# Patient Record
Sex: Female | Born: 1980 | Race: Black or African American | Hispanic: No | Marital: Single | Smoking: Never smoker
Health system: Southern US, Community
[De-identification: ages and names within clinical notes are randomized; demographics above are authoritative.]

---

## 2013-12-03 ENCOUNTER — Emergency Department: Payer: Self-pay | Admitting: Emergency Medicine

## 2014-06-01 ENCOUNTER — Emergency Department: Payer: Self-pay | Admitting: Emergency Medicine

## 2014-06-01 LAB — URINALYSIS, COMPLETE
BLOOD: NEGATIVE
Bilirubin,UR: NEGATIVE
Glucose,UR: NEGATIVE mg/dL (ref 0–75)
Ketone: NEGATIVE
NITRITE: NEGATIVE
Ph: 5 (ref 4.5–8.0)
RBC,UR: 8 /HPF (ref 0–5)
SPECIFIC GRAVITY: 1.03 (ref 1.003–1.030)
Squamous Epithelial: 10

## 2014-06-01 LAB — GC/CHLAMYDIA PROBE AMP

## 2014-06-01 LAB — WET PREP, GENITAL

## 2014-06-02 LAB — URINE CULTURE

## 2014-06-19 ENCOUNTER — Emergency Department: Payer: Self-pay | Admitting: Emergency Medicine

## 2014-08-13 ENCOUNTER — Emergency Department
Admit: 2014-08-13 | Disposition: A | Discharge status: Home / Self Care | Payer: Self-pay | Admitting: Emergency Medicine

## 2014-08-15 LAB — BETA STREP CULTURE(ARMC)

## 2014-10-25 ENCOUNTER — Emergency Department
Admission: EM | Admit: 2014-10-25 | Discharge: 2014-10-25 | Disposition: A | Discharge status: Home / Self Care | Payer: Medicaid Other | Attending: Emergency Medicine | Admitting: Emergency Medicine

## 2014-10-25 ENCOUNTER — Encounter: Payer: Self-pay | Admitting: Emergency Medicine

## 2014-10-25 DIAGNOSIS — R21 Rash and other nonspecific skin eruption: Secondary | ICD-10-CM | POA: Diagnosis present

## 2014-10-25 MED ORDER — HYDROXYZINE PAMOATE 25 MG PO CAPS: 25.0000 mg | ORAL_CAPSULE | Freq: Three times a day (TID) | ORAL | Status: AC | PRN

## 2014-10-25 MED ORDER — PREDNISONE 10 MG (21) PO TBPK: ORAL_TABLET | ORAL | Status: AC

## 2014-10-25 MED ORDER — LORATADINE 10 MG PO TABS: 10.0000 mg | ORAL_TABLET | Freq: Every day | ORAL | Status: AC | PRN

## 2014-10-25 MED ORDER — DOXYCYCLINE HYCLATE 100 MG PO CAPS: 100.0000 mg | ORAL_CAPSULE | Freq: Two times a day (BID) | ORAL | Status: AC

## 2014-10-25 MED ORDER — FLUCONAZOLE 100 MG PO TABS: 100.0000 mg | ORAL_TABLET | Freq: Every day | ORAL | Status: AC

## 2014-10-25 NOTE — ED Notes (Signed)
Pt reports started with a rash on Sunday night. States she felt a sting and now has a rash on her neck.

## 2014-10-25 NOTE — ED Provider Notes (Signed)
[facility] Emergency Department Provider Note  ____________________________________________  Time seen: 61  I have reviewed the triage vital signs and the nursing notes.   HISTORY  Chief Complaint Rash    HPI Elizabeth Rocha is a 34 y.o. female patient arrives today with what appear to be bites to her neck and back and shoulder patient stated a motel over the weekend concerned about possible bedbug sits on the areas become red firm says they itch more than they hurt overall would say her discomfort as about an 8 out of 10 nothing making it particularly better or worse no other symptoms at this time is here today for further evaluation and treatment   No past medical history on file.  There are no active problems to display for this patient.   No past surgical history on file.  Current Outpatient Rx  Name  Route  Sig  Dispense  Refill  . doxycycline (VIBRAMYCIN) 100 MG capsule   Oral   Take 1 capsule (100 mg total) by mouth 2 (two) times daily.   14 capsule   0   . fluconazole (DIFLUCAN) 100 MG tablet   Oral   Take 1 tablet (100 mg total) by mouth daily.   14 tablet   0   . hydrOXYzine (VISTARIL) 25 MG capsule   Oral   Take 1 capsule (25 mg total) by mouth 3 (three) times daily as needed for itching.   15 capsule   0   . loratadine (CLARITIN) 10 MG tablet   Oral   Take 1 tablet (10 mg total) by mouth daily as needed for allergies.   30 tablet   2   . predniSONE (STERAPRED UNI-PAK 21 TAB) 10 MG (21) TBPK tablet      Take 6 tablets on day 1 Take 5 tablets on day 2 Take 4 tablets on day 3 Take 3 tablets on day 4 Take 2 tablets on day 5 Take 1 tablet on day 6   21 tablet   0     Allergies Lorcet and Percocet  No family history on file.  Social History History  Substance Use Topics  . Smoking status: Never Smoker   . Smokeless tobacco: Not on file  . Alcohol Use: No    Review of Systems Constitutional: No  fever/chills Eyes: No visual changes. ENT: No sore throat. Cardiovascular: Denies chest pain. Respiratory: Denies shortness of breath. Gastrointestinal: No abdominal pain.  No nausea, no vomiting.  No diarrhea.  No constipation. Genitourinary: Negative for dysuria. Musculoskeletal: Negative for back pain. Skin: Negative for rash. Neurological: Negative for headaches, focal weakness or numbness.  10-point ROS otherwise negative.  ____________________________________________   PHYSICAL EXAM:  VITAL SIGNS: ED Triage Vitals  Enc Vitals Group     BP 10/25/14 0748 131/67 mmHg     Pulse Rate 10/25/14 0748 78     Resp 10/25/14 0748 20     Temp 10/25/14 0748 98.1 F (36.7 C)     Temp Source 10/25/14 0748 Oral     SpO2 10/25/14 0748 100 %     Weight 10/25/14 0748 165 lb (74.844 kg)     Height 10/25/14 0748 5' (1.524 m)     Head Cir --      Peak Flow --      Pain Score 10/25/14 0749 8     Pain Loc --      Pain Edu? --      Excl. in GC? --  Constitutional: Alert and oriented. Well appearing and in no acute distress. Eyes: Conjunctivae are normal. PERRL. EOMI. Head: Atraumatic. Nose: No congestion/rhinnorhea. Mouth/Throat: Mucous membranes are moist.  Oropharynx non-erythematous. Neck: No stridor.   Cardiovascular: Normal rate, regular rhythm. Grossly normal heart sounds.  Good peripheral circulation. Respiratory: Normal respiratory effort.  No retractions. Lungs CTAB. Musculoskeletal: No lower extremity tenderness nor edema.  No joint effusions. Neurologic:  Normal speech and language. No gross focal neurologic deficits are appreciated. Speech is normal. No gait instability. Skin:  Skin is warm, dry and intact. Multiple bite like lesions to her neck and back some red firm and inflamed Psychiatric: Mood and affect are normal. Speech and behavior are normal.  ____________________________________________     PROCEDURES  Procedure(s) performed: None  Critical Care  performed: No  ____________________________________________   INITIAL IMPRESSION / ASSESSMENT AND PLAN / ED COURSE  Pertinent labs & imaging results that were available during my care of the patient were reviewed by me and considered in my medical decision making (see chart for details).  Initial impression skin rash probable bug bites patient stated no motor cells concerned about bed bugs given the distribution patient also concerned of infection will start her on anti-histamine steroid-induced antibiotics follow-up here as needed for any worsening rash return if any acute concerns or worsening symptoms ____________________________________________   FINAL CLINICAL IMPRESSION(S) / ED DIAGNOSES  Final diagnoses:  Rash and nonspecific skin eruption      Jeramy Dimmick Rosalyn GessWilliam C Tyliek Timberman, PA-C 10/25/14 [id number]  Sharman CheekPhillip Stafford, MD 10/25/14 [phone]

## 2015-01-10 ENCOUNTER — Ambulatory Visit
Admission: RE | Admit: 2015-01-10 | Discharge: 2015-01-10 | Disposition: A | Discharge status: Home / Self Care | Payer: Medicaid Other | Source: Ambulatory Visit | Attending: Family Medicine | Admitting: Family Medicine

## 2015-01-10 ENCOUNTER — Other Ambulatory Visit: Payer: Self-pay | Admitting: Family Medicine

## 2015-01-10 DIAGNOSIS — M25552 Pain in left hip: Secondary | ICD-10-CM | POA: Insufficient documentation

## 2015-02-16 ENCOUNTER — Other Ambulatory Visit: Payer: Self-pay | Admitting: Orthopedic Surgery

## 2015-02-16 DIAGNOSIS — M25551 Pain in right hip: Secondary | ICD-10-CM

## 2015-02-16 DIAGNOSIS — G8929 Other chronic pain: Secondary | ICD-10-CM

## 2015-02-16 DIAGNOSIS — M25552 Pain in left hip: Principal | ICD-10-CM

## 2015-02-16 DIAGNOSIS — M5441 Lumbago with sciatica, right side: Secondary | ICD-10-CM

## 2015-02-16 DIAGNOSIS — M5442 Lumbago with sciatica, left side: Secondary | ICD-10-CM

## 2015-02-28 ENCOUNTER — Ambulatory Visit
Admission: RE | Admit: 2015-02-28 | Discharge: 2015-02-28 | Disposition: A | Discharge status: Home / Self Care | Payer: Medicaid Other | Source: Ambulatory Visit | Attending: Orthopedic Surgery | Admitting: Orthopedic Surgery

## 2015-02-28 DIAGNOSIS — M1288 Other specific arthropathies, not elsewhere classified, other specified site: Secondary | ICD-10-CM | POA: Insufficient documentation

## 2015-02-28 DIAGNOSIS — M5442 Lumbago with sciatica, left side: Secondary | ICD-10-CM

## 2015-02-28 DIAGNOSIS — G8929 Other chronic pain: Secondary | ICD-10-CM | POA: Diagnosis not present

## 2015-02-28 DIAGNOSIS — M25551 Pain in right hip: Secondary | ICD-10-CM | POA: Insufficient documentation

## 2015-02-28 DIAGNOSIS — M25552 Pain in left hip: Secondary | ICD-10-CM | POA: Diagnosis present

## 2015-02-28 DIAGNOSIS — M545 Low back pain: Secondary | ICD-10-CM | POA: Diagnosis present

## 2015-02-28 DIAGNOSIS — M5441 Lumbago with sciatica, right side: Secondary | ICD-10-CM

## 2015-08-08 ENCOUNTER — Emergency Department
Admission: EM | Admit: 2015-08-08 | Discharge: 2015-08-08 | Disposition: A | Discharge status: Home / Self Care | Payer: Medicaid Other | Attending: Emergency Medicine | Admitting: Emergency Medicine

## 2015-08-08 ENCOUNTER — Encounter: Payer: Self-pay | Admitting: Emergency Medicine

## 2015-08-08 DIAGNOSIS — K625 Hemorrhage of anus and rectum: Secondary | ICD-10-CM | POA: Insufficient documentation

## 2015-08-08 DIAGNOSIS — Z79899 Other long term (current) drug therapy: Secondary | ICD-10-CM | POA: Diagnosis not present

## 2015-08-08 DIAGNOSIS — Z792 Long term (current) use of antibiotics: Secondary | ICD-10-CM | POA: Insufficient documentation

## 2015-08-08 LAB — CBC
HCT: 36 % (ref 35.0–47.0)
Hemoglobin: 12.1 g/dL (ref 12.0–16.0)
MCH: 28.3 pg (ref 26.0–34.0)
MCHC: 33.7 g/dL (ref 32.0–36.0)
MCV: 84 fL (ref 80.0–100.0)
PLATELETS: 351 10*3/uL (ref 150–440)
RBC: 4.29 MIL/uL (ref 3.80–5.20)
RDW: 13.5 % (ref 11.5–14.5)
WBC: 6.7 10*3/uL (ref 3.6–11.0)

## 2015-08-08 LAB — COMPREHENSIVE METABOLIC PANEL
ALK PHOS: 53 U/L (ref 38–126)
ALT: 10 U/L — AB (ref 14–54)
AST: 13 U/L — ABNORMAL LOW (ref 15–41)
Albumin: 4.3 g/dL (ref 3.5–5.0)
Anion gap: 5 (ref 5–15)
BILIRUBIN TOTAL: 0.9 mg/dL (ref 0.3–1.2)
BUN: 8 mg/dL (ref 6–20)
CALCIUM: 9.2 mg/dL (ref 8.9–10.3)
CO2: 26 mmol/L (ref 22–32)
CREATININE: 0.61 mg/dL (ref 0.44–1.00)
Chloride: 104 mmol/L (ref 101–111)
Glucose, Bld: 101 mg/dL — ABNORMAL HIGH (ref 65–99)
Potassium: 3.5 mmol/L (ref 3.5–5.1)
Sodium: 135 mmol/L (ref 135–145)
TOTAL PROTEIN: 7.8 g/dL (ref 6.5–8.1)

## 2015-08-08 MED ORDER — HYDROCORTISONE 2.5 % RE CREA: 1.0000 "application " | TOPICAL_CREAM | Freq: Two times a day (BID) | RECTAL | Status: AC

## 2015-08-08 NOTE — Discharge Instructions (Signed)
Gastrointestinal Bleeding °Gastrointestinal (GI) bleeding means there is bleeding somewhere along the digestive tract, between the mouth and anus. °CAUSES  °There are many different problems that can cause GI bleeding. Possible causes include: °· Esophagitis. This is inflammation, irritation, or swelling of the esophagus. °· Hemorrhoids. These are veins that are full of blood (engorged) in the rectum. They cause pain, inflammation, and may bleed. °· Anal fissures. These are areas of painful tearing which may bleed. They are often caused by passing hard stool. °· Diverticulosis. These are pouches that form on the colon over time, with age, and may bleed significantly. °· Diverticulitis. This is inflammation in areas with diverticulosis. It can cause pain, fever, and bloody stools, although bleeding is rare. °· Polyps and cancer. Colon cancer often starts out as precancerous polyps. °· Gastritis and ulcers. Bleeding from the upper gastrointestinal tract (near the stomach) may travel through the intestines and produce black, sometimes tarry, often bad smelling stools. In certain cases, if the bleeding is fast enough, the stools may not be black, but red. This condition may be life-threatening. °SYMPTOMS  °· Vomiting bright red blood or material that looks like coffee grounds. °· Bloody, black, or tarry stools. °DIAGNOSIS  °Your caregiver may diagnose your condition by taking your history and performing a physical exam. More tests may be needed, including: °· X-rays and other imaging tests. °· Esophagogastroduodenoscopy (EGD). This test uses a flexible, lighted tube to look at your esophagus, stomach, and small intestine. °· Colonoscopy. This test uses a flexible, lighted tube to look at your colon. °TREATMENT  °Treatment depends on the cause of your bleeding.  °· For bleeding from the esophagus, stomach, small intestine, or colon, the caregiver doing your EGD or colonoscopy may be able to stop the bleeding as part of  the procedure. °· Inflammation or infection of the colon can be treated with medicines. °· Many rectal problems can be treated with creams, suppositories, or warm baths. °· Surgery is sometimes needed. °· Blood transfusions are sometimes needed if you have lost a lot of blood. °If bleeding is slow, you may be allowed to go home. If there is a lot of bleeding, you will need to stay in the hospital for observation. °HOME CARE INSTRUCTIONS  °· Take any medicines exactly as prescribed. °· Keep your stools soft by eating foods that are high in fiber. These foods include whole grains, legumes, fruits, and vegetables. Prunes (1 to 3 a day) work well for many people. °· Drink enough fluids to keep your urine clear or pale yellow. °SEEK IMMEDIATE MEDICAL CARE IF:  °· Your bleeding increases. °· You feel lightheaded, weak, or you faint. °· You have severe cramps in your back or abdomen. °· You pass large blood clots in your stool. °· Your problems are getting worse. °MAKE SURE YOU:  °· Understand these instructions. °· Will watch your condition. °· Will get help right away if you are not doing well or get worse. °  °This information is not intended to replace advice given to you by your health care provider. Make sure you discuss any questions you have with your health care provider. °  °Document Released: 04/11/2000 Document Revised: 03/31/2012 Document Reviewed: 10/02/2014 °Elsevier Interactive Patient Education ©2016 [employer] ° °Please return immediately if condition worsens. Please contact her primary physician or the physician you were given for referral. If you have any specialist physicians involved in her treatment and plan please also contact them. Thank you for using [facility]. ° °

## 2015-08-08 NOTE — ED Provider Notes (Signed)
Time Seen: Approximately 1840  I have reviewed the triage notes  Chief Complaint: Rectal Bleeding   History of Present Illness: Elizabeth Rocha is a 35 y.o. female who presents with some small amount of rectal bleeding. She states the bleeding started out as small bright red spots on on the tissue and then she noticed that she had more significant bleeding described as more of a dark maroon color. She denies any abdominal pain. She states it was approximately half a cup of blood. She denies any large clots and states that she has had some rectal pain. Patient denies any feelings of lightheadedness or shortness of breath. She denies any family history of  early colon cancer   History reviewed. No pertinent past medical history.  There are no active problems to display for this patient.   History reviewed. No pertinent past surgical history.  History reviewed. No pertinent past surgical history.  Current Outpatient Rx  Name  Route  Sig  Dispense  Refill  . doxycycline (VIBRAMYCIN) 100 MG capsule   Oral   Take 1 capsule (100 mg total) by mouth 2 (two) times daily.   14 capsule   0   . hydrocortisone (ANUSOL-HC) 2.5 % rectal cream   Rectal   Place 1 application rectally 2 (two) times daily.   30 g   0   . hydrOXYzine (VISTARIL) 25 MG capsule   Oral   Take 1 capsule (25 mg total) by mouth 3 (three) times daily as needed for itching.   15 capsule   0   . loratadine (CLARITIN) 10 MG tablet   Oral   Take 1 tablet (10 mg total) by mouth daily as needed for allergies.   30 tablet   2   . predniSONE (STERAPRED UNI-PAK 21 TAB) 10 MG (21) TBPK tablet      Take 6 tablets on day 1 Take 5 tablets on day 2 Take 4 tablets on day 3 Take 3 tablets on day 4 Take 2 tablets on day 5 Take 1 tablet on day 6   21 tablet   0     Allergies:  Lorcet and Percocet  Family History: No family history on file.  Social History: Social History  Substance Use Topics  . Smoking  status: Never Smoker   . Smokeless tobacco: None  . Alcohol Use: No     Review of Systems:   10 point review of systems was performed and was otherwise negative:  Constitutional: No fever Eyes: No visual disturbances ENT: No sore throat, ear pain Cardiac: No chest pain Respiratory: No shortness of breath, wheezing, or stridor Abdomen: No abdominal pain, no vomiting, No diarrhea Endocrine: No weight loss, No night sweats Extremities: No peripheral edema, cyanosis Skin: No rashes, easy bruising Neurologic: No focal weakness, trouble with speech or swollowing Urologic: No dysuria, Hematuria, or urinary frequency  Physical Exam:  ED Triage Vitals  Enc Vitals Group     BP 08/08/15 1729 117/62 mmHg     Pulse Rate 08/08/15 1729 85     Resp 08/08/15 1729 18     Temp 08/08/15 1729 98.7 F (37.1 C)     Temp Source 08/08/15 1729 Oral     SpO2 08/08/15 1729 97 %     Weight 08/08/15 1729 179 lb (81.194 kg)     Height 08/08/15 1729 5' (1.524 m)     Head Cir --      Peak Flow --  Pain Score 08/08/15 1730 7     Pain Loc --      Pain Edu? --      Excl. in GC? --     General: Awake , Alert , and Oriented times 3; GCS 15 Head: Normal cephalic , atraumatic Eyes: Pupils equal , round, reactive to light Nose/Throat: No nasal drainage, patent upper airway without erythema or exudate.  Neck: Supple, Full range of motion, No anterior adenopathy or palpable thyroid masses Lungs: Clear to ascultation without wheezes , rhonchi, or rales Heart: Regular rate, regular rhythm without murmurs , gallops , or rubs Abdomen: Soft, non tender without rebound, guarding , or rigidity; bowel sounds positive and symmetric in all 4 quadrants. No organomegaly .        Extremities: 2 plus symmetric pulses. No edema, clubbing or cyanosis Neurologic: normal ambulation, Motor symmetric without deficits, sensory intact Skin: warm, dry, no rashes Rectal exam with chaperone present shows normal sphincter  tone. Possible small internal hemorrhoid palpated against the posterior rectal wall without any obvious hard masses. Stool is guaiac positive with a dark maroon-type stool in the rectal vault.  Labs:   All laboratory work was reviewed including any pertinent negatives or positives listed below:  Labs Reviewed  COMPREHENSIVE METABOLIC PANEL - Abnormal; Notable for the following:    Glucose, Bld 101 (*)    AST 13 (*)    ALT 10 (*)    All other components within normal limits  CBC   Laboratory work was reviewed and showed no clinically significant abnormalities.    ED Course: * Patient's stay here was uneventful and I felt given the differential for lower gastrointestinal bleeding including internal hemorrhoids, external hemorrhoids, diverticulosis, inflammatory bowel disease, colon or rectal cancer, that this most likely was bleeding from an internal hemorrhoid. The patient was referred to gastroenterology unassigned and was advised that I cannot see further up in the GI tract based on bedside assessment and she may need a colonoscopy to better define where the bleeding is coming from. Does not appear to be severe bleeding and she is currently not on any anticoagulant therapy.    Assessment:  Rectal bleeding Internal hemorrhoid   Final Clinical Impression:   Final diagnoses:  Rectal bleeding     Plan:  Outpatient management Patient was advised to return immediately if condition worsens. Patient was advised to follow up with their primary care physician or other specialized physicians involved in their outpatient care. The patient and/or family member/power of attorney had laboratory results reviewed at the bedside. All questions and concerns were addressed and appropriate discharge instructions were distributed by the nursing staff.             Jennye Moccasin, MD 08/08/15 2009

## 2015-08-08 NOTE — ED Notes (Signed)
Pt presents to ED with complaints of rectal bleeding. Pt denies history of hemorrhoids. Pt denies constipation. Pt states has seen blood on tissue and in toilet and describes blood as a lot. Pt denies abdominal pain. Pt reports nausea. Pt denies vomiting and diarrhea.

## 2016-01-25 ENCOUNTER — Emergency Department
Admission: EM | Admit: 2016-01-25 | Discharge: 2016-01-26 | Disposition: A | Discharge status: Home / Self Care | Payer: Medicaid Other | Attending: Emergency Medicine | Admitting: Emergency Medicine

## 2016-01-25 ENCOUNTER — Encounter: Payer: Self-pay | Admitting: Emergency Medicine

## 2016-01-25 DIAGNOSIS — R1084 Generalized abdominal pain: Secondary | ICD-10-CM

## 2016-01-25 DIAGNOSIS — O23591 Infection of other part of genital tract in pregnancy, first trimester: Secondary | ICD-10-CM | POA: Diagnosis not present

## 2016-01-25 DIAGNOSIS — O26891 Other specified pregnancy related conditions, first trimester: Secondary | ICD-10-CM | POA: Insufficient documentation

## 2016-01-25 DIAGNOSIS — N898 Other specified noninflammatory disorders of vagina: Secondary | ICD-10-CM | POA: Diagnosis not present

## 2016-01-25 DIAGNOSIS — Z3490 Encounter for supervision of normal pregnancy, unspecified, unspecified trimester: Secondary | ICD-10-CM

## 2016-01-25 DIAGNOSIS — B9689 Other specified bacterial agents as the cause of diseases classified elsewhere: Secondary | ICD-10-CM

## 2016-01-25 DIAGNOSIS — Z3A01 Less than 8 weeks gestation of pregnancy: Secondary | ICD-10-CM | POA: Insufficient documentation

## 2016-01-25 DIAGNOSIS — R109 Unspecified abdominal pain: Secondary | ICD-10-CM

## 2016-01-25 DIAGNOSIS — N760 Acute vaginitis: Secondary | ICD-10-CM

## 2016-01-25 LAB — URINALYSIS COMPLETE WITH MICROSCOPIC (ARMC ONLY)
BACTERIA UA: NONE SEEN
Bilirubin Urine: NEGATIVE
GLUCOSE, UA: NEGATIVE mg/dL
HGB URINE DIPSTICK: NEGATIVE
Ketones, ur: NEGATIVE mg/dL
LEUKOCYTES UA: NEGATIVE
Nitrite: NEGATIVE
Protein, ur: 30 mg/dL — AB
Specific Gravity, Urine: 1.023 (ref 1.005–1.030)
pH: 7 (ref 5.0–8.0)

## 2016-01-25 LAB — COMPREHENSIVE METABOLIC PANEL
ALT: 14 U/L (ref 14–54)
AST: 12 U/L — ABNORMAL LOW (ref 15–41)
Albumin: 4 g/dL (ref 3.5–5.0)
Alkaline Phosphatase: 44 U/L (ref 38–126)
Anion gap: 5 (ref 5–15)
BUN: 10 mg/dL (ref 6–20)
CHLORIDE: 105 mmol/L (ref 101–111)
CO2: 25 mmol/L (ref 22–32)
CREATININE: 0.5 mg/dL (ref 0.44–1.00)
Calcium: 8.9 mg/dL (ref 8.9–10.3)
GLUCOSE: 85 mg/dL (ref 65–99)
POTASSIUM: 3.5 mmol/L (ref 3.5–5.1)
Sodium: 135 mmol/L (ref 135–145)
Total Bilirubin: 0.4 mg/dL (ref 0.3–1.2)
Total Protein: 7.3 g/dL (ref 6.5–8.1)

## 2016-01-25 LAB — CBC
HEMATOCRIT: 33.6 % — AB (ref 35.0–47.0)
Hemoglobin: 11.4 g/dL — ABNORMAL LOW (ref 12.0–16.0)
MCH: 28.1 pg (ref 26.0–34.0)
MCHC: 33.9 g/dL (ref 32.0–36.0)
MCV: 83 fL (ref 80.0–100.0)
Platelets: 375 10*3/uL (ref 150–440)
RBC: 4.04 MIL/uL (ref 3.80–5.20)
RDW: 14.2 % (ref 11.5–14.5)
WBC: 8.4 10*3/uL (ref 3.6–11.0)

## 2016-01-25 LAB — LIPASE, BLOOD: LIPASE: 20 U/L (ref 11–51)

## 2016-01-25 LAB — POCT PREGNANCY, URINE: PREG TEST UR: POSITIVE — AB

## 2016-01-25 LAB — HCG, QUANTITATIVE, PREGNANCY: HCG, BETA CHAIN, QUANT, S: 15290 m[IU]/mL — AB (ref <5)

## 2016-01-25 NOTE — ED Triage Notes (Signed)
Pt ambulatory to triage in NAD, reports generalized abd pain x 6 hours, reports started lower abd and now generalized, described as cramping.  Denies n/v/d.

## 2016-01-26 ENCOUNTER — Encounter: Payer: Self-pay | Admitting: Emergency Medicine

## 2016-01-26 ENCOUNTER — Emergency Department: Payer: Medicaid Other

## 2016-01-26 LAB — CHLAMYDIA/NGC RT PCR (ARMC ONLY)
Chlamydia Tr: NOT DETECTED
N GONORRHOEAE: NOT DETECTED

## 2016-01-26 LAB — WET PREP, GENITAL
SPERM: NONE SEEN
Trich, Wet Prep: NONE SEEN
YEAST WET PREP: NONE SEEN

## 2016-01-26 MED ORDER — IBUPROFEN 600 MG PO TABS
600.0000 mg | ORAL_TABLET | Freq: Once | ORAL | Status: AC
  Administered 2016-01-26: 600 mg via ORAL
  Filled 2016-01-26: qty 1

## 2016-01-26 MED ORDER — METRONIDAZOLE 500 MG PO TABS: 500.0000 mg | ORAL_TABLET | Freq: Two times a day (BID) | ORAL | 0 refills | Status: AC

## 2016-01-26 NOTE — Discharge Instructions (Signed)
You have been seen in the Emergency Department (ED) for abdominal pain.  Your evaluation did not identify source of the discomfort but was generally reassuring.  You appear to be in the early stages of pregnancy but at this time there is no evidence of a fetus on ultrasound.  It is very important that he follow up with the OB/GYN providers at the next available opportunity for some repeat blood work.  They will likely also schedule an outpatient ultrasound follow up and make sure that the pregnancy is developing.    Please follow up as instructed above regarding today?s emergent visit and the symptoms that are bothering you.  Return to the ED if your abdominal pain worsens or fails to improve, you develop bloody vomiting, bloody diarrhea, you are unable to tolerate fluids due to vomiting, fever greater than 101, or other symptoms that concern you.

## 2016-01-26 NOTE — ED Provider Notes (Signed)
[facility] Emergency Department Provider Note  ____________________________________________   First MD Initiated Contact with Patient 01/26/16 0009     (approximate)  I have reviewed the triage vital signs and the nursing notes.   HISTORY  Chief Complaint Abdominal Pain    HPI Elizabeth Rocha is a 35 y.o. female 756P3 (one was a twin gestation) Ab 2 whose LMP was about a month ago who presents with generalized abdominal pain.  It has been gradual in onset over the last 6 hours and is moderate to severe.  She reports that it started in the lower abdomen and her lower back and feels like cramping or contractions.  Over the last few hours the pain has generalized and is now present in her upper abdomen as well.  She did not know she was pregnant until the testing was done in the emergency department.  He has not had any nausea, vomiting, nor diarrhea.  She denies dysuria.  She denies fever/chills, chest pain, shortness of breath.  She states that nothing makes the discomfort better and that moving around makes it worse.  She has not had any pain with intercourse lately although it has been a couple weeks since she last had sex.She is monogamous with one female partner and they do not use condoms.  He reports an increase of whitish vaginal discharge recently but denies any vaginal bleeding.   History reviewed. No pertinent past medical history.  There are no active problems to display for this patient.   History reviewed. No pertinent surgical history.  Prior to Admission medications   Medication Sig Start Date End Date Taking? Authorizing Provider  doxycycline (VIBRAMYCIN) 100 MG capsule Take 1 capsule (100 mg total) by mouth 2 (two) times daily. 10/25/14   III Kristine GarbeWilliam C Ruffian, PA-C  hydrocortisone (ANUSOL-HC) 2.5 % rectal cream Place 1 application rectally 2 (two) times daily. 08/08/15   Jennye MoccasinBrian S Quigley, MD  hydrOXYzine (VISTARIL) 25 MG capsule Take 1 capsule (25  mg total) by mouth 3 (three) times daily as needed for itching. 10/25/14   III Kristine GarbeWilliam C Ruffian, PA-C  loratadine (CLARITIN) 10 MG tablet Take 1 tablet (10 mg total) by mouth daily as needed for allergies. 10/25/14 10/25/15  III William C Ruffian, PA-C  metroNIDAZOLE (FLAGYL) 500 MG tablet Take 1 tablet (500 mg total) by mouth 2 (two) times daily. 01/26/16   Loleta Roseory Rayjon Wery, MD  predniSONE (STERAPRED UNI-PAK 21 TAB) 10 MG (21) TBPK tablet Take 6 tablets on day 1 Take 5 tablets on day 2 Take 4 tablets on day 3 Take 3 tablets on day 4 Take 2 tablets on day 5 Take 1 tablet on day 6 10/25/14   III Rosalyn GessWilliam C Ruffian, PA-C    Allergies Lorcet [hydrocodone-acetaminophen] and Percocet [oxycodone-acetaminophen]  History reviewed. No pertinent family history.  Social History Social History  Substance Use Topics  . Smoking status: Never Smoker  . Smokeless tobacco: Never Used  . Alcohol use No    Review of Systems Constitutional: No fever/chills Eyes: No visual changes. ENT: No sore throat. Cardiovascular: Denies chest pain. Respiratory: Denies shortness of breath. Gastrointestinal: +abdominal pain.  No nausea, no vomiting.  No diarrhea.  No constipation. Genitourinary: Negative for dysuria. Vaginal discharge recently Musculoskeletal: Lower abd pain and lower back pain. Skin: Negative for rash. Neurological: Negative for headaches, focal weakness or numbness.  10-point ROS otherwise negative.  ____________________________________________   PHYSICAL EXAM:  VITAL SIGNS: ED Triage Vitals  Enc Vitals Group  BP 01/25/16 1953 116/68     Pulse Rate 01/25/16 1953 72     Resp 01/25/16 1953 18     Temp 01/25/16 1953 98.4 F (36.9 C)     Temp Source 01/25/16 1953 Oral     SpO2 01/25/16 1953 100 %     Weight 01/25/16 1954 174 lb (78.9 kg)     Height 01/25/16 1954 5' (1.524 m)     Head Circumference --      Peak Flow --      Pain Score 01/25/16 1954 8     Pain Loc --      Pain Edu? --       Excl. in GC? --     Constitutional: Alert and oriented. Well appearing and in no acute distress though she does appear uncomfortable. Eyes: Conjunctivae are normal. PERRL. EOMI. Head: Atraumatic. Nose: No congestion/rhinnorhea. Mouth/Throat: Mucous membranes are moist.  Oropharynx non-erythematous. Neck: No stridor.  No meningeal signs.   Cardiovascular: Normal rate, regular rhythm. Good peripheral circulation. Grossly normal heart sounds. Respiratory: Normal respiratory effort.  No retractions. Lungs CTAB. Gastrointestinal: Soft , obese, generalized tenderness to palpation throughout with no rebound or guarding.  She is tender in all quadrants including the right upper quadrant and does gasp when checking for Murphy sign Genitourinary:  Normal external exam.  Moderate amount of whitish discharge in the vaginal vault.  Normal-appearing cervix that is closed.  Bimanual exam was tender in general as is her abdominal exam but there is no specific cervical motion tenderness nor adnexal tenderness.  Nurse chaperone was present throughout exam. Musculoskeletal: No lower extremity tenderness nor edema. No gross deformities of extremities. Neurologic:  Normal speech and language. No gross focal neurologic deficits are appreciated.  Skin:  Skin is warm, dry and intact. No rash noted. Psychiatric: Mood and affect are normal. Speech and behavior are normal.  ____________________________________________   LABS (all labs ordered are listed, but only abnormal results are displayed)  Labs Reviewed  WET PREP, GENITAL - Abnormal; Notable for the following:       Result Value   Clue Cells Wet Prep HPF POC PRESENT (*)    WBC, Wet Prep HPF POC FEW (*)    All other components within normal limits  COMPREHENSIVE METABOLIC PANEL - Abnormal; Notable for the following:    AST 12 (*)    All other components within normal limits  CBC - Abnormal; Notable for the following:    Hemoglobin 11.4 (*)    HCT  33.6 (*)    All other components within normal limits  URINALYSIS COMPLETEWITH MICROSCOPIC ([facility] ONLY) - Abnormal; Notable for the following:    Color, Urine YELLOW (*)    APPearance HAZY (*)    Protein, ur 30 (*)    Squamous Epithelial / LPF 6-30 (*)    All other components within normal limits  HCG, QUANTITATIVE, PREGNANCY - Abnormal; Notable for the following:    hCG, Beta Chain, Quant, S 15,290 (*)    All other components within normal limits  POCT PREGNANCY, URINE - Abnormal; Notable for the following:    Preg Test, Ur POSITIVE (*)    All other components within normal limits  CHLAMYDIA/NGC RT PCR ([facility] ONLY)  URINE CULTURE  LIPASE, BLOOD  POC URINE PREG, ED   ____________________________________________  EKG  None - EKG not ordered by ED physician ____________________________________________  RADIOLOGY   US Ob Comp Less 14 Wks  Result Date: 01/26/2016 CLINICAL DATA:  Generalized  upper and lower abdominal pain for 10 hours. Estimated gestational age by LMP is 5 weeks 5 days. Quantitative beta HCG is 15,290. EXAM: OBSTETRIC <14 WK [place] AND TRANSVAGINAL OB US TECHNIQUE: Both transabdominal and transvaginal ultrasound examinations were performed for complete evaluation of the gestation as well as the maternal uterus, adnexal regions, and pelvic cul-de-sac. Transvaginal technique was performed to assess early pregnancy. COMPARISON:  None. FINDINGS: Intrauterine gestational sac: A single intrauterine gestational sac is present. Yolk sac:  Yolk sac is present. Embryo:  Not identified. Cardiac Activity: Not identified. MSD: 10  mm   5 w   5  d Subchorionic hemorrhage:  None visualized. Maternal uterus/adnexae: Uterus is anteverted and retroflexed. Small nabothian cysts is seen in the cervix. Both ovaries are visualized and appear normal. Probable corpus luteum cyst on the right. Small follicular cyst on the left. Small amount of free fluid in the pelvis. IMPRESSION: Probable  early intrauterine gestational sac with a yolk sac, but no fetal pole or cardiac activity yet visualized. Recommend follow-up quantitative B-HCG levels and follow-up US in 14 days to confirm and assess viability. This recommendation follows SRU consensus guidelines: Diagnostic Criteria for Nonviable Pregnancy Early in the First Trimester. Malva Limes Med 2013; 161:0960-45. Electronically Signed   By: Burman Nieves M.D.   On: 01/26/2016 01:56   US Ob Transvaginal  Result Date: 01/26/2016 CLINICAL DATA:  Generalized upper and lower abdominal pain for 10 hours. Estimated gestational age by LMP is 5 weeks 5 days. Quantitative beta HCG is 15,290. EXAM: OBSTETRIC <14 WK [place] AND TRANSVAGINAL OB US TECHNIQUE: Both transabdominal and transvaginal ultrasound examinations were performed for complete evaluation of the gestation as well as the maternal uterus, adnexal regions, and pelvic cul-de-sac. Transvaginal technique was performed to assess early pregnancy. COMPARISON:  None. FINDINGS: Intrauterine gestational sac: A single intrauterine gestational sac is present. Yolk sac:  Yolk sac is present. Embryo:  Not identified. Cardiac Activity: Not identified. MSD: 10  mm   5 w   5  d Subchorionic hemorrhage:  None visualized. Maternal uterus/adnexae: Uterus is anteverted and retroflexed. Small nabothian cysts is seen in the cervix. Both ovaries are visualized and appear normal. Probable corpus luteum cyst on the right. Small follicular cyst on the left. Small amount of free fluid in the pelvis. IMPRESSION: Probable early intrauterine gestational sac with a yolk sac, but no fetal pole or cardiac activity yet visualized. Recommend follow-up quantitative B-HCG levels and follow-up US in 14 days to confirm and assess viability. This recommendation follows SRU consensus guidelines: Diagnostic Criteria for Nonviable Pregnancy Early in the First Trimester. Malva Limes Med 2013; 409:8119-14. Electronically Signed   By: Burman Nieves  M.D.   On: 01/26/2016 01:56   US Abdomen Limited Ruq  Result Date: 01/26/2016 CLINICAL DATA:  Lower and upper abdominal pain for 10 hours. New diagnosis pregnancy. EXAM: US ABDOMEN LIMITED - RIGHT UPPER QUADRANT COMPARISON:  None. FINDINGS: Gallbladder: No gallstones or wall thickening visualized. No sonographic Murphy sign noted by sonographer. Common bile duct: Diameter: 2.4 mm, normal Liver: No focal lesion identified. Within normal limits in parenchymal echogenicity. IMPRESSION: Normal examination. Electronically Signed   By: Burman Nieves M.D.   On: 01/26/2016 02:32    ____________________________________________   PROCEDURES  Procedure(s) performed:   Procedures   Critical Care performed: No ____________________________________________   INITIAL IMPRESSION / ASSESSMENT AND PLAN / ED COURSE  Pertinent labs & imaging results that were available during my care of the patient  were reviewed by me and considered in my medical decision making (see chart for details).  The patient's discomfort may be all related to her undiagnosed pregnancy and she is quite diffusely tender throughout her abdomen.  Pelvic exam was generally unremarkable.  There is no history of vaginal bleeding and no evidence of blood on pelvic exam.  Her labs are all reassuring.  I will proceed with an ultrasound both of the pregnancy (pelvic and transvaginal) but also the right upper quadrant to rule out a gallbladder etiology.   Clinical Course  Value Comment By Time  N gonorrhoeae: NOT DETECTED STD tests negative.  +BV. Loleta Rose, MD 09/30 0208  US OB Transvaginal No fetus/fetal pole visualized.  Will discuss with patient.  Currently no evidence of ectopic pregnancy. 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  Uncertain if it is the opioid component or acetaminophen, but we will err on the side of caution and give ibuprofen  which is not contraindicated given the very early pregnancy and lack of fetal sac/development. Loleta Rose, MD 09/30 [phone]   The patient is comfortable in spite of her continued abdominal discomfort.  She has a nonsurgical abdomen, normal vital signs, and reassuring lab results.  I stressed or in person the importance of her following up with OB/GYN for repeat testing of her hCG and likely for repeat outpatient ultrasound.I gave my usual and customary return precautions.  Loleta Rose, MD 09/30 0315    ____________________________________________  FINAL CLINICAL IMPRESSION(S) / ED DIAGNOSES  Final diagnoses:  Generalized abdominal pain  Bacterial vaginosis  Pregnancy at early stage     MEDICATIONS GIVEN DURING THIS VISIT:  Medications  ibuprofen (ADVIL,MOTRIN) tablet 600 mg (600 mg Oral Given 01/26/16 0259)     NEW OUTPATIENT MEDICATIONS STARTED DURING THIS VISIT:  New Prescriptions   METRONIDAZOLE (FLAGYL) 500 MG TABLET    Take 1 tablet (500 mg total) by mouth 2 (two) times daily.    Modified Medications   No medications on file    Discontinued Medications   No medications on file     Note:  This document was prepared using Dragon voice recognition software and may include unintentional dictation errors.    Loleta Rose, MD 01/26/16 [phone]

## 2016-01-27 LAB — URINE CULTURE
Culture: NO GROWTH
Special Requests: NORMAL

## 2016-02-06 ENCOUNTER — Ambulatory Visit
Admission: RE | Admit: 2016-02-06 | Discharge: 2016-02-06 | Disposition: A | Discharge status: Home / Self Care | Payer: Medicaid Other | Source: Ambulatory Visit | Attending: Family Medicine | Admitting: Family Medicine

## 2016-02-06 ENCOUNTER — Other Ambulatory Visit: Payer: Self-pay | Admitting: Family Medicine

## 2016-02-06 DIAGNOSIS — O0289 Other abnormal products of conception: Secondary | ICD-10-CM

## 2016-02-06 DIAGNOSIS — Z3A11 11 weeks gestation of pregnancy: Secondary | ICD-10-CM | POA: Insufficient documentation

## 2016-02-06 DIAGNOSIS — Z3481 Encounter for supervision of other normal pregnancy, first trimester: Secondary | ICD-10-CM | POA: Diagnosis not present

## 2018-05-22 IMAGING — US US OB TRANSVAGINAL
1 series · 13 of 28 positions shown · non-contrast
Comparison: None.

CLINICAL DATA: Generalized upper and lower abdominal pain for 10
hours. Estimated gestational age by LMP is 5 weeks 5 days.
Quantitative beta HCG is [DATE].

EXAM:
OBSTETRIC <14 WK US AND TRANSVAGINAL OB US
TECHNIQUE: Both transabdominal and transvaginal ultrasound examinations were
performed for complete evaluation of the gestation as well as the
maternal uterus, adnexal regions, and pelvic cul-de-sac.
Transvaginal technique was performed to assess early pregnancy.

[Series 1: us ob transvaginal · 0.12mm/px · 13 of 115 slices shown]
[im 5/115]
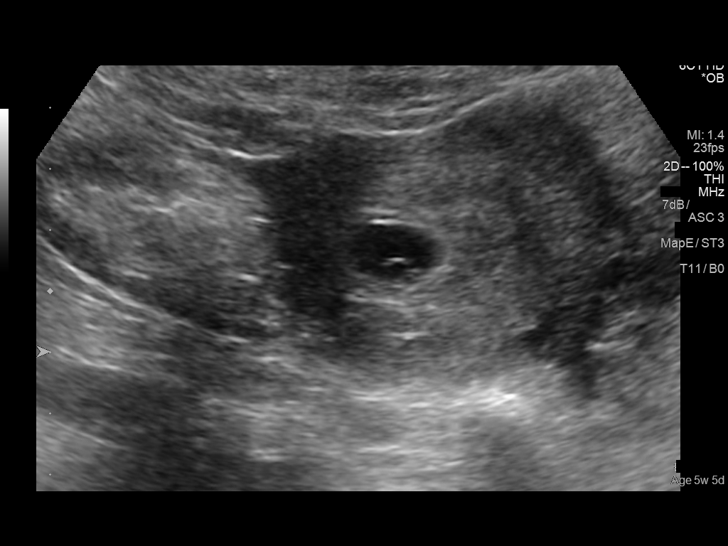
[im 13/115]
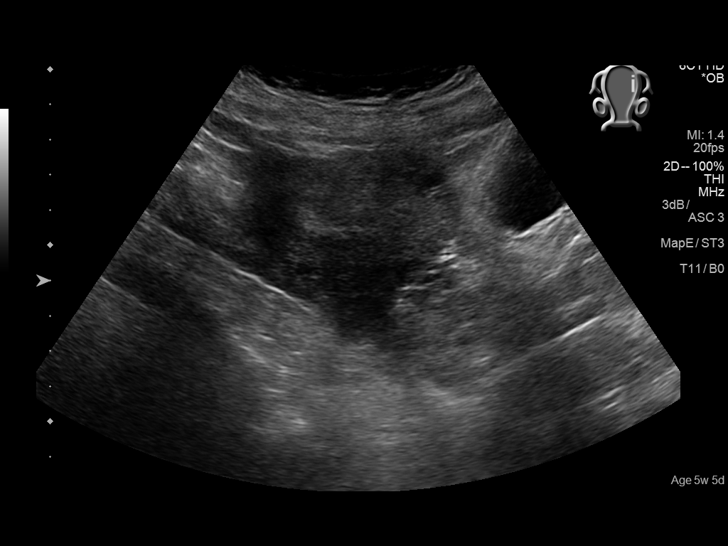
[im 22/115]
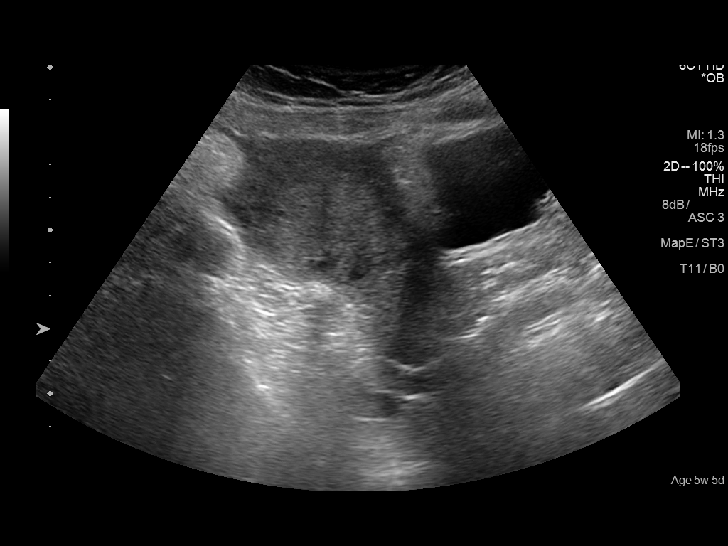
[im 30/115]
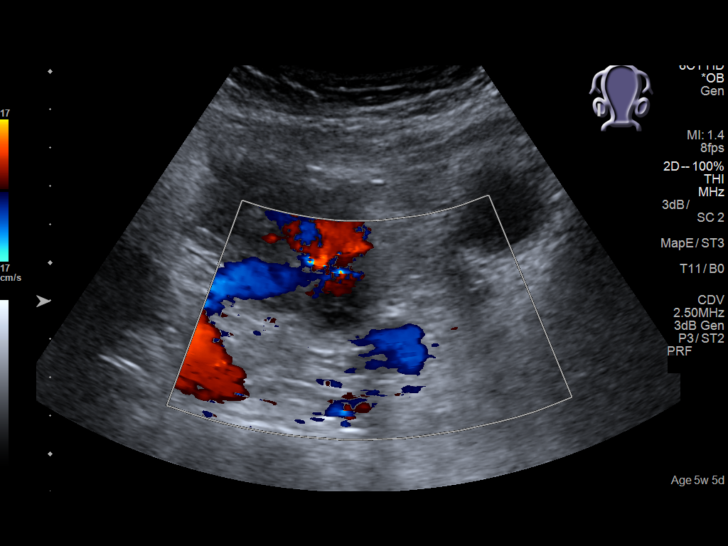
[im 39/115]
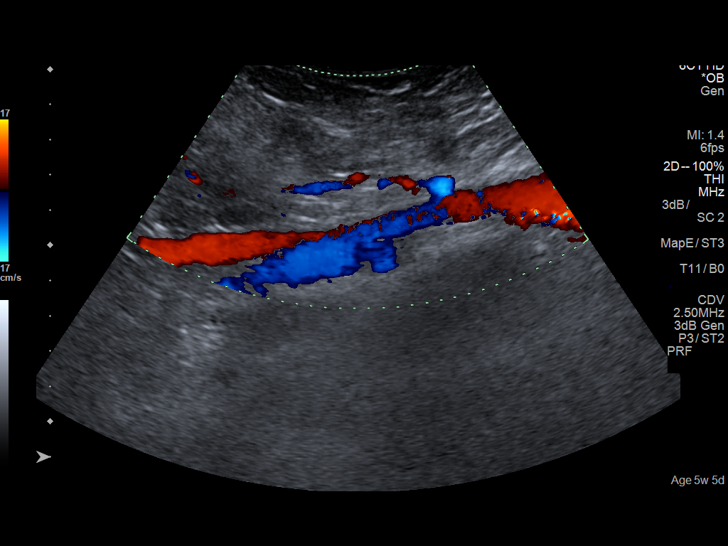
[im 47/115]
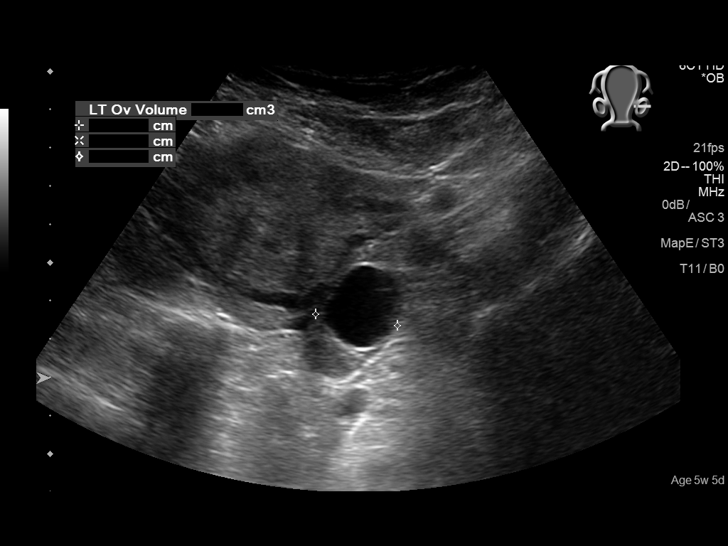
[im 60/115]
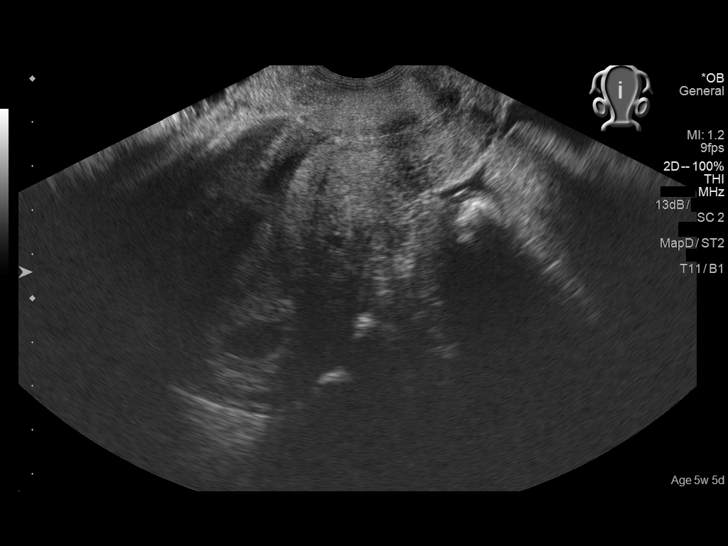
[im 68/115]
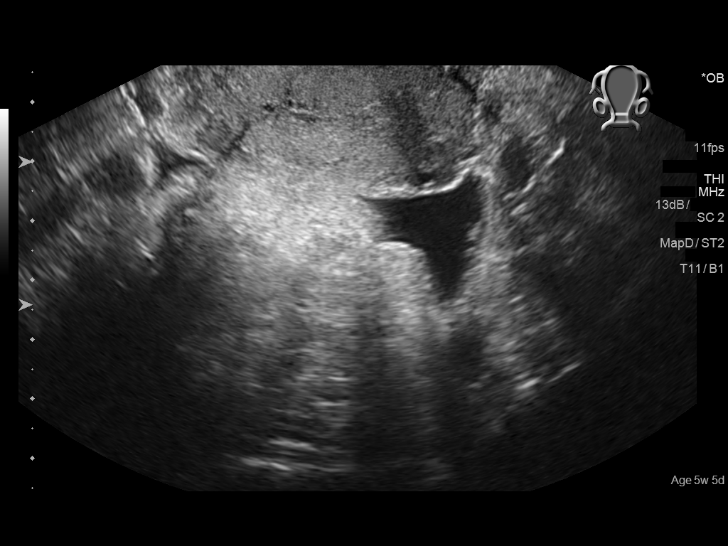
[im 77/115]
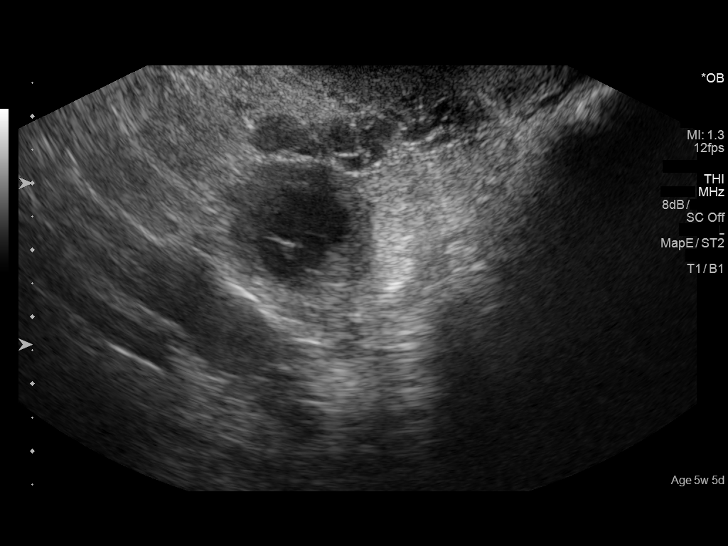
[im 85/115]
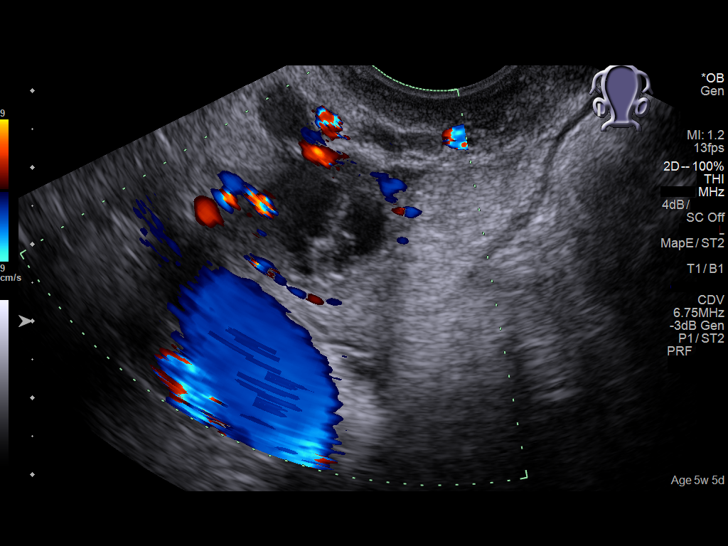
[im 93/115]
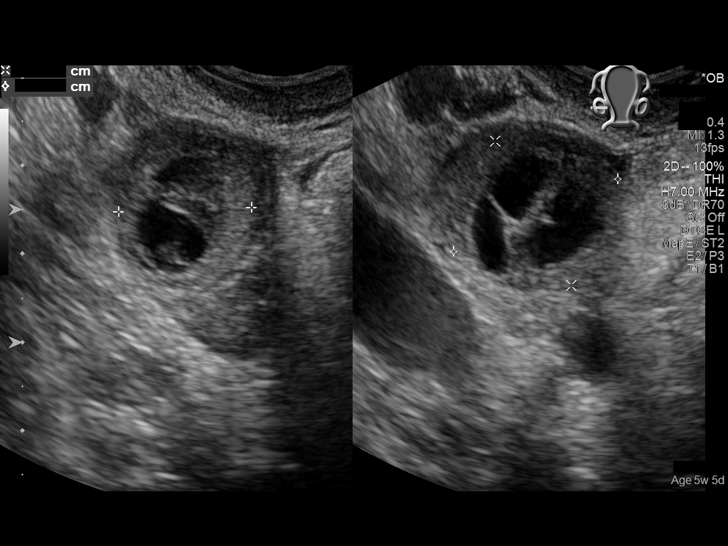
[im 102/115]
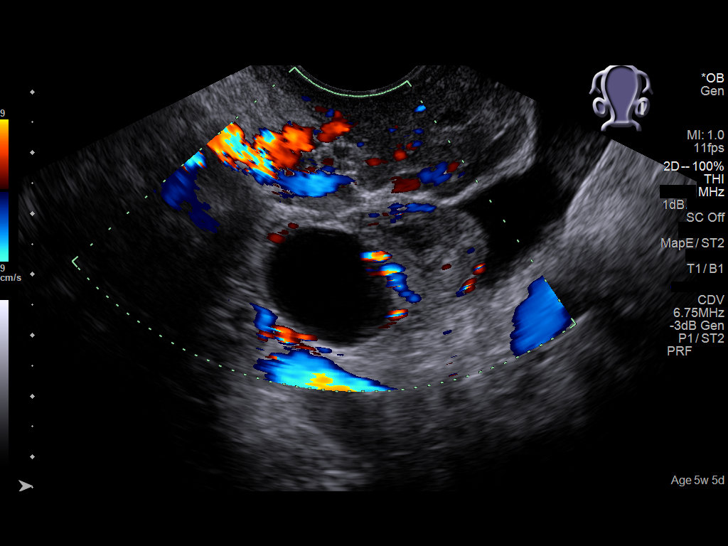
[im 110/115]
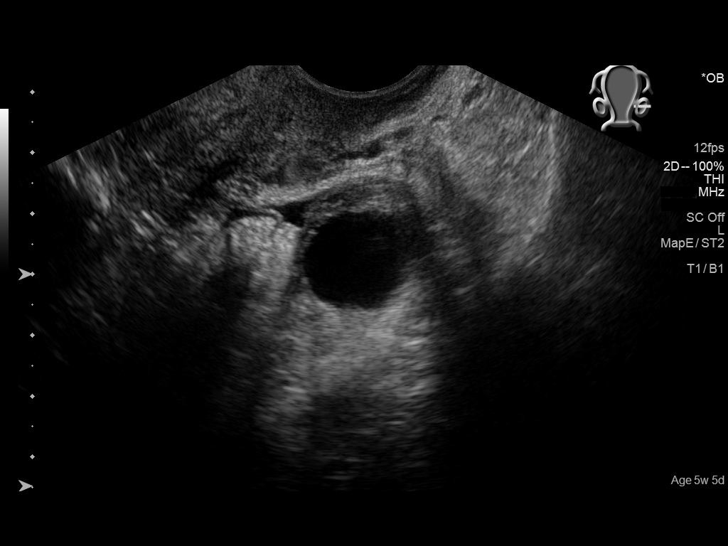

[13 of 28 positions shown; findings below may reference images not displayed]

FINDINGS: Intrauterine gestational sac: A single intrauterine gestational sac
is present.

Yolk sac:  Yolk sac is present.

Embryo:  Not identified.

Cardiac Activity: Not identified.

MSD: 10  mm   5 w   5  d

Subchorionic hemorrhage:  None visualized.

Maternal uterus/adnexae: Uterus is anteverted and retroflexed. Small
nabothian cysts is seen in the cervix. Both ovaries are visualized
and appear normal. Probable corpus luteum cyst on the right. Small
follicular cyst on the left. Small amount of free fluid in the
pelvis.
IMPRESSION: Probable early intrauterine gestational sac with a yolk sac, but no
fetal pole or cardiac activity yet visualized. Recommend follow-up
quantitative B-HCG levels and follow-up US in 14 days to confirm and
assess viability. This recommendation follows SRU consensus
guidelines: Diagnostic Criteria for Nonviable Pregnancy Early in the
First Trimester. N Engl J Med 9429; [DATE].

## 2018-06-02 IMAGING — US US OB COMP LESS 14 WK
1 series · 14 of 28 positions shown · non-contrast
Comparison: Cough

CLINICAL DATA: Viability

EXAM:
OBSTETRIC <14 WK US AND TRANSVAGINAL OB US
TECHNIQUE: Both transabdominal and transvaginal ultrasound examinations were
performed for complete evaluation of the gestation as well as the
maternal uterus, adnexal regions, and pelvic cul-de-sac.
Transvaginal technique was performed to assess early pregnancy.

[Series 1: us ob comp less 14 wk · 0.11mm/px · 14 of 135 slices shown]
[im 5/135]
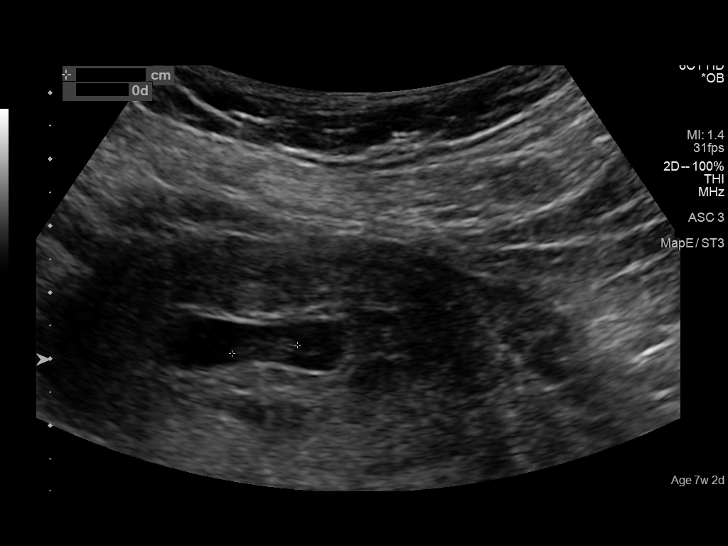
[im 15/135]
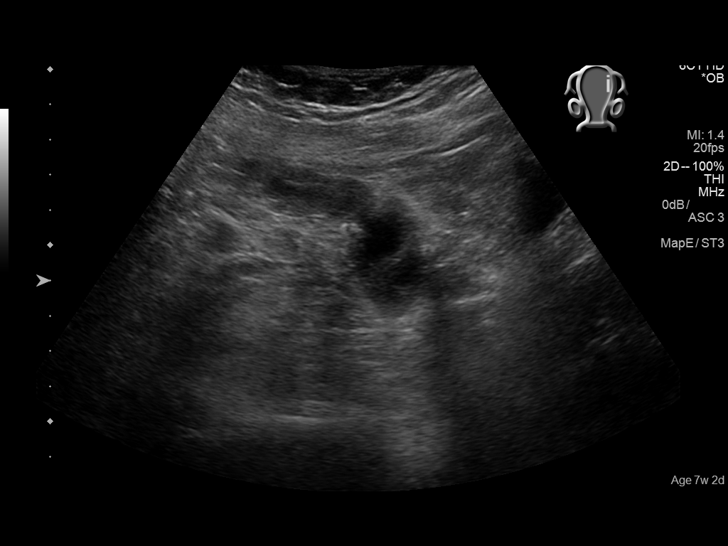
[im 25/135]
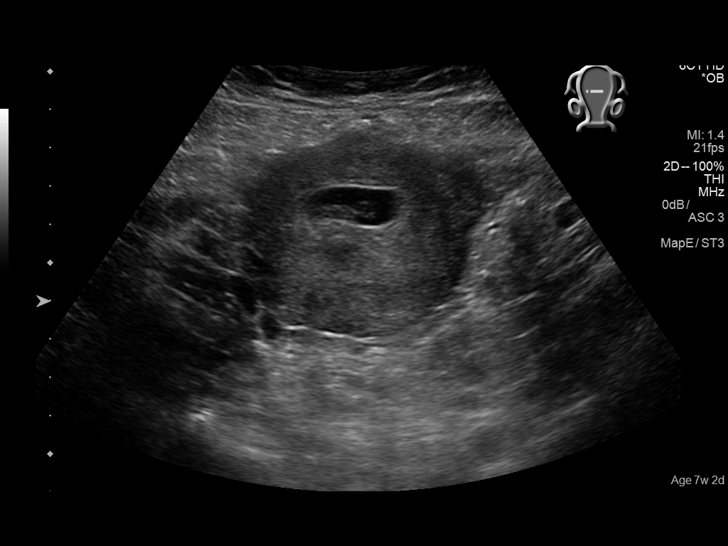
[im 35/135]
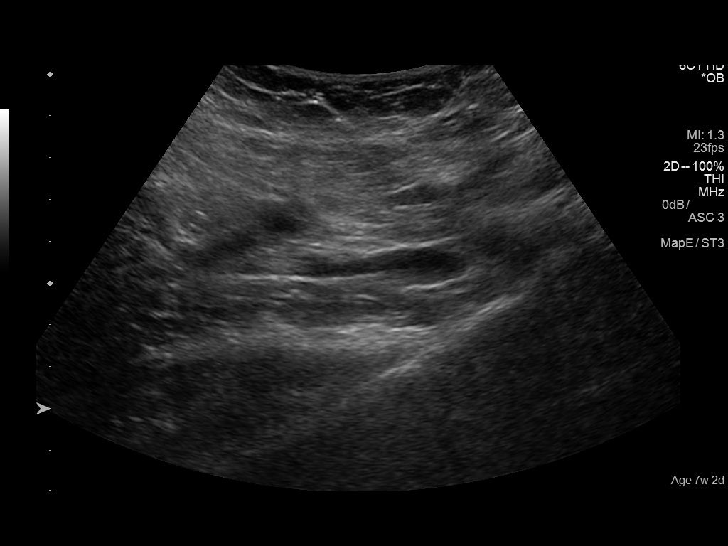
[im 45/135]
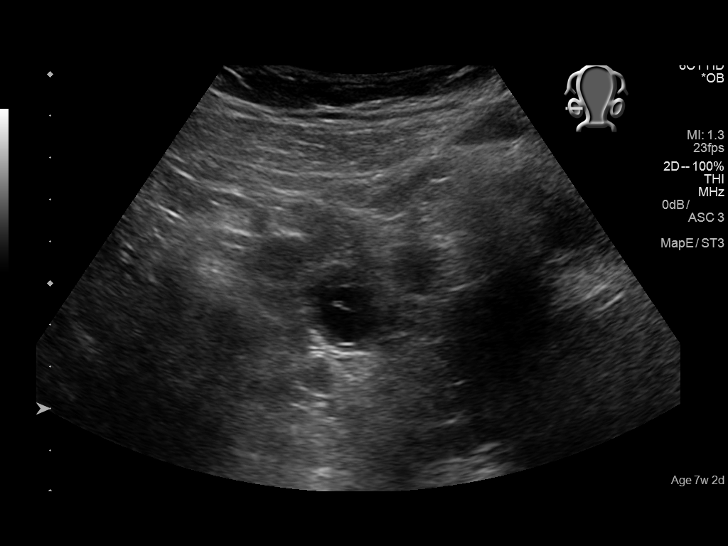
[im 55/135]
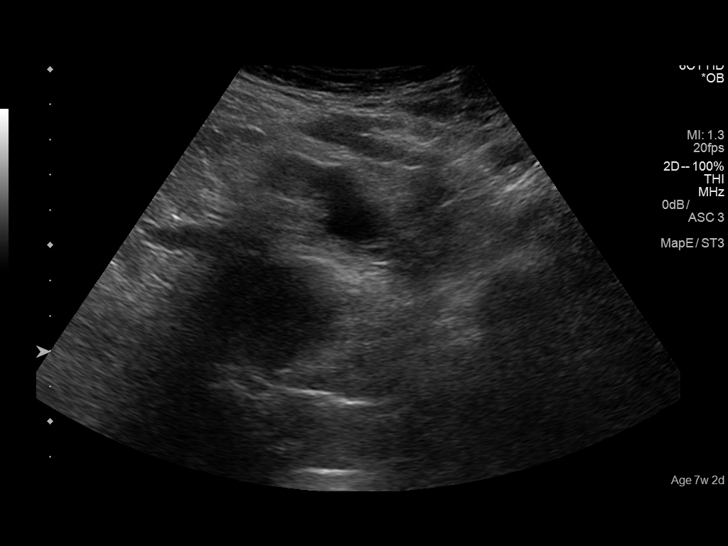
[im 65/135]
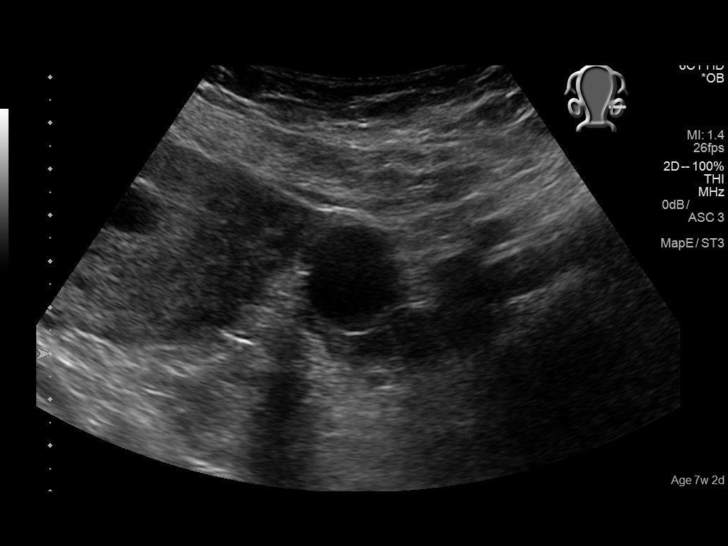
[im 75/135]
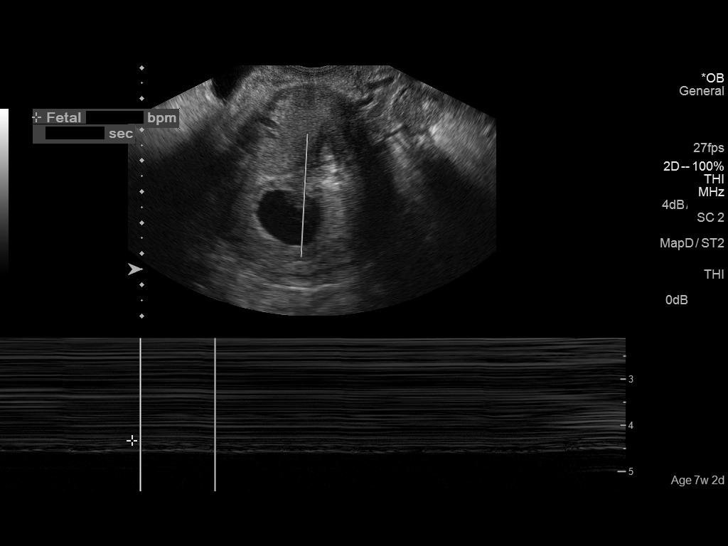
[im 85/135]
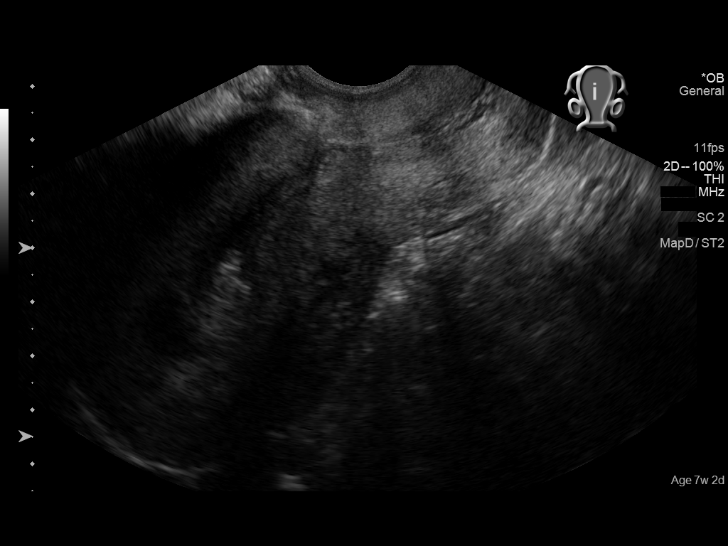
[im 95/135]
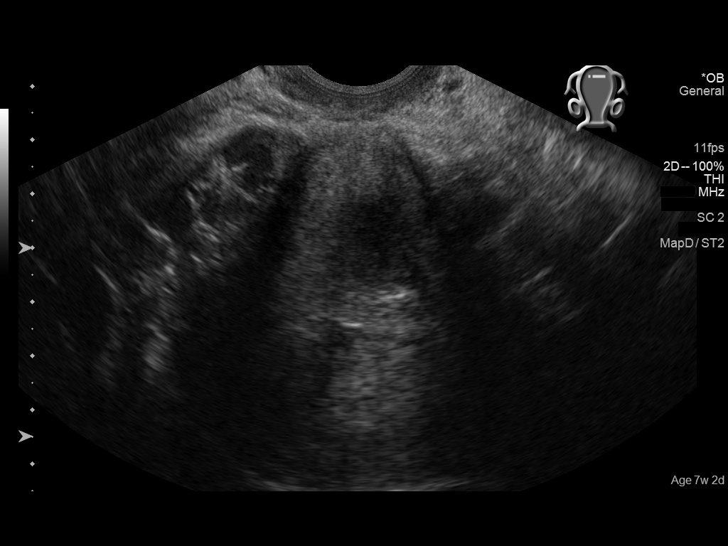
[im 105/135]
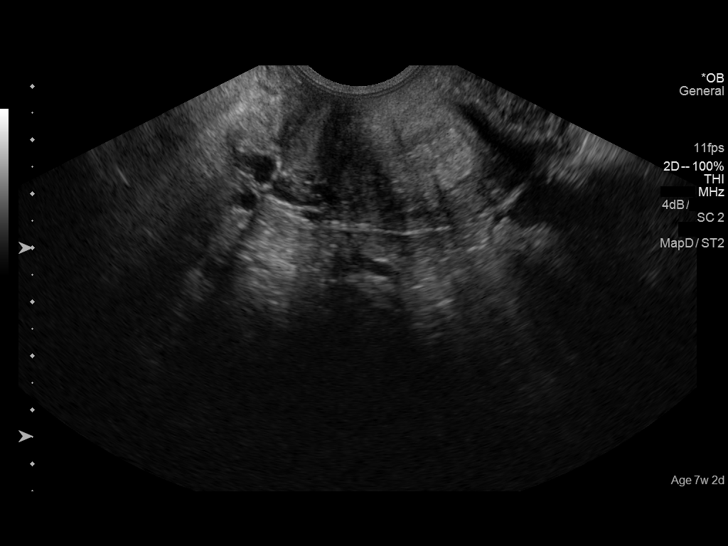
[im 115/135]
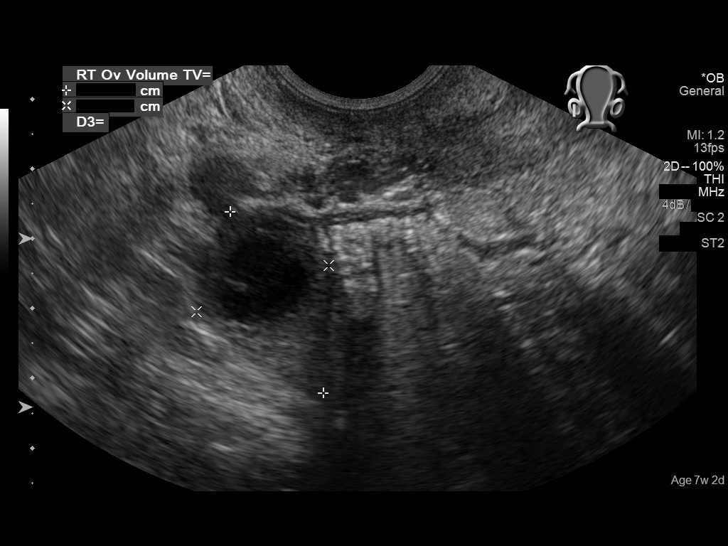
[im 125/135]
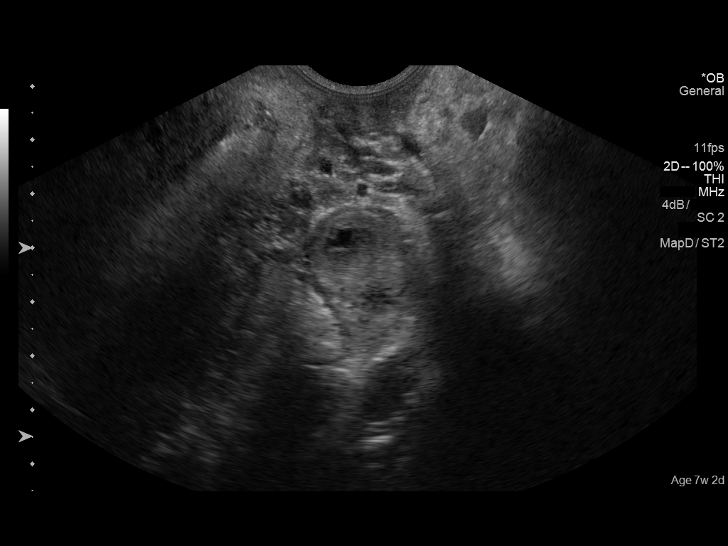
[im 135/135]
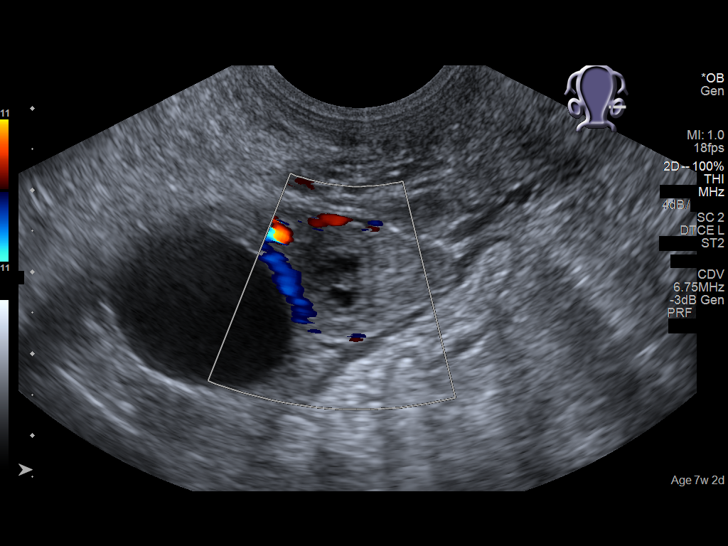

[14 of 28 positions shown; findings below may reference images not displayed]

FINDINGS: Intrauterine gestational sac: Single

Yolk sac:  Visualized

Embryo:  Visualized

Cardiac Activity: Visualized

Heart Rate: 137  bpm

MSD:   mm    w     d

CRL:  11  mm   7 w   1 d                  US EDC: 09/23/2016

Subchorionic hemorrhage:  None visualized.

Maternal uterus/adnexae: No adnexal masses. No free fluid. Left
corpus luteal cyst.
IMPRESSION: Seven week 1 day intrauterine pregnancy. Fetal heart rate 137 beats
per minute. No acute maternal findings.
